# Patient Record
Sex: Male | Born: 1960 | Race: Black or African American | Hispanic: No | Marital: Single
Health system: Southern US, Community
[De-identification: ages and names within clinical notes are randomized; demographics above are authoritative.]

## PROBLEM LIST (undated history)

## (undated) DIAGNOSIS — Z9889 Other specified postprocedural states: Secondary | ICD-10-CM

## (undated) DIAGNOSIS — R569 Unspecified convulsions: Secondary | ICD-10-CM

## (undated) DIAGNOSIS — F79 Unspecified intellectual disabilities: Secondary | ICD-10-CM

## (undated) HISTORY — DX: Unspecified convulsions: R56.9

## (undated) HISTORY — PX: OTHER SURGICAL HISTORY: SHX169

## (undated) HISTORY — DX: Unspecified intellectual disabilities: F79

## (undated) HISTORY — DX: Other specified postprocedural states: Z98.890

---

## 2002-02-27 DIAGNOSIS — Z9889 Other specified postprocedural states: Secondary | ICD-10-CM

## 2002-02-27 HISTORY — DX: Other specified postprocedural states: Z98.890

## 2002-10-13 ENCOUNTER — Ambulatory Visit (HOSPITAL_COMMUNITY): Admission: RE | Admit: 2002-10-13 | Discharge: 2002-10-13 | Payer: Self-pay | Admitting: Internal Medicine

## 2004-04-28 ENCOUNTER — Ambulatory Visit (HOSPITAL_COMMUNITY): Admission: RE | Admit: 2004-04-28 | Discharge: 2004-04-28 | Payer: Self-pay | Admitting: General Surgery

## 2005-08-21 ENCOUNTER — Emergency Department (HOSPITAL_COMMUNITY): Admission: EM | Admit: 2005-08-21 | Discharge: 2005-08-21 | Payer: Self-pay | Admitting: Emergency Medicine

## 2010-08-06 ENCOUNTER — Emergency Department (HOSPITAL_COMMUNITY)
Admission: EM | Admit: 2010-08-06 | Discharge: 2010-08-06 | Disposition: A | Discharge status: Home / Self Care | Payer: Medicaid Other | Attending: Emergency Medicine | Admitting: Emergency Medicine

## 2010-08-06 DIAGNOSIS — G40909 Epilepsy, unspecified, not intractable, without status epilepticus: Secondary | ICD-10-CM | POA: Insufficient documentation

## 2010-08-06 DIAGNOSIS — K921 Melena: Secondary | ICD-10-CM | POA: Insufficient documentation

## 2010-08-06 LAB — PROTIME-INR: Prothrombin Time: 13.1 seconds (ref 11.6–15.2)

## 2010-08-06 LAB — BASIC METABOLIC PANEL
Creatinine, Ser: 1 mg/dL (ref 0.4–1.5)
GFR calc Af Amer: >60 mL/min (ref >60)
Glucose, Bld: 93 mg/dL (ref 70–99)
Potassium: 3.2 mEq/L — ABNORMAL LOW (ref 3.5–5.1)
Sodium: 140 mEq/L (ref 135–145)

## 2010-08-06 LAB — CBC
MCH: 33.2 pg (ref 26.0–34.0)
MCHC: 33.5 g/dL (ref 30.0–36.0)
MCV: 99 fL (ref 78.0–100.0)
RBC: 3.89 MIL/uL — ABNORMAL LOW (ref 4.22–5.81)
WBC: 4.2 10*3/uL (ref 4.0–10.5)

## 2010-08-11 ENCOUNTER — Ambulatory Visit (INDEPENDENT_AMBULATORY_CARE_PROVIDER_SITE_OTHER): Payer: Medicaid Other | Admitting: Gastroenterology

## 2010-08-11 ENCOUNTER — Encounter: Payer: Self-pay | Admitting: Gastroenterology

## 2010-08-11 VITALS — BP 112/71 | HR 67 | Temp 97.0°F | Ht 63.0 in | Wt 169.6 lb

## 2010-08-11 DIAGNOSIS — K625 Hemorrhage of anus and rectum: Secondary | ICD-10-CM | POA: Insufficient documentation

## 2010-08-11 NOTE — Progress Notes (Signed)
Referring Provider: Alice Reichert, MD Primary Care Physician:  Alice Reichert, MD Primary Gastroenterologist:  Dr. Jena Gauss  Chief Complaint  Patient presents with  . Rectal Bleeding    HPI:  Daniel Swanson is a 50 y.o. male here as a referral from Dr. Renard Matter for rectal bleeding. 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 His mother is here with him, who responded to questions asked. Mother states that rectal bleeding, small amount, has occurred intermittently. It is quite difficult to obtain a hx. Apparently, the pt went to the ED on Saturday. Hgb stable at 12.9. Mom states Mr. Montz normally has 1 large BM every 2-3 days. No change in bowel habits, no diarrhea. Pt does not act as if he is in pain. He is eating well, maintaining weight. Stays with uncle.   Past Medical History  Diagnosis Date  . Seizures     hx   . Mental retardation   . S/P colonoscopy 2004    normal (RMR)    Past Surgical History  Procedure Date  . None     Current Outpatient Prescriptions  Medication Sig Dispense Refill  . pantoprazole (PROTONIX) 40 MG tablet Take 40 mg by mouth daily.        Marland Kitchen PHENobarbital (LUMINAL) 32.4 MG tablet Take 32.4 mg by mouth 2 (two) times daily.        . potassium chloride SA (K-DUR,KLOR-CON) 20 MEQ tablet Take 20 mEq by mouth 2 (two) times daily.          Allergies as of 08/11/2010  . (No Known Allergies)    Family History  Problem Relation Age of Onset  . Diabetes Mother     living  . Colon cancer Maternal Aunt     living, unsure when diagnosed    History   Social History  . Marital Status: Single    Spouse Name: N/A    Number of Children: N/A  . Years of Education: N/A   Occupational History  . Not on file.   Social History Main Topics  . Smoking status: Never Smoker   . Smokeless tobacco: Not on file  . Alcohol Use: No  . Drug Use: No  . Sexually Active: Not on file     Review of Systems: Unable to obtain; pt is mentally  handicapped.  Physical Exam: BP 112/71  Pulse 67  Temp(Src) 97 F (36.1 C) (Temporal)  Ht 5\' 3"  (1.6 m)  Wt 169 lb 9.6 oz (76.93 kg)  BMI 30.04 kg/m2 General:   Well-nourished, sitting in chair, raises head to his name, smiles occasionally. Responds to simple commands to get on table.  Head:  Normocephalic and atraumatic. Eyes:  Sclera clear, no icterus.   Conjunctiva pink.  Ears:  Normal auditory acuity. Nose:  No deformity, discharge,  or lesions. Mouth:  Unable to assess. Pt will not open mouth.  Neck:  Supple; no masses or thyromegaly. Lungs:  Limited exam. Pt will not take in deep breaths; however, no wheezing noted, appears clear. Heart:  Regular rate and rhythm; no murmurs, clicks, rubs,  or gallops. Abdomen:  Soft, nontender and nondistended. No masses, hepatosplenomegaly or hernias noted. Normal bowel sounds, without guarding, and without rebound.   Rectal:  Deferred until time of colonoscopy.   Msk:  Symmetrical without gross deformities. Somewhat stooped and shuffles slightly when walks Extremities:  Without clubbing or edema. Neurologic:  Unable to assess Skin:  Intact without significant lesions or rashes. Psych:  Alert and cooperative. Normal mood and affect.

## 2010-08-11 NOTE — Progress Notes (Signed)
Cc to PCP 

## 2010-08-11 NOTE — Patient Instructions (Signed)
We have set you up for a colonoscopy.  Monitor for any signs of increased rectal bleeding, abdominal pain, nausea or vomiting.  Further recommendations to follow.  No changes to medications currently.

## 2010-08-11 NOTE — Assessment & Plan Note (Signed)
50 year old African-American, mentally handicapped pleasant man who presents with rectal bleeding, course unknown. Somewhat difficult to obtain hx, as pt does not communicate verbally. Mother was present and provided information. No change in bowel habits, rectal bleeding small amount and intermittent. One visit to ED, where Hgb was 12.9 (early June). Last colonoscopy in 2004 was normal. No wt loss or lack of appetite. No visible signs of abdominal pain per Mother. Likely hematochezia due to benign anorectal source, but unable to exclude other etiology such as occult malignancy at this time.  Proceed with TCS with Dr. Jena Gauss in near future: the risks, benefits, and alternatives have been discussed with the patient in detail. The patient states understanding and desires to proceed. Discussed in detail with Mother as well.

## 2010-09-05 ENCOUNTER — Encounter (HOSPITAL_COMMUNITY)
Admission: RE | Disposition: A | Discharge status: Home / Self Care | Payer: Self-pay | Source: Ambulatory Visit | Attending: Internal Medicine

## 2010-09-05 ENCOUNTER — Encounter: Payer: Medicaid Other | Admitting: Internal Medicine

## 2010-09-05 ENCOUNTER — Ambulatory Visit (HOSPITAL_COMMUNITY)
Admission: RE | Admit: 2010-09-05 | Discharge: 2010-09-05 | Disposition: A | Discharge status: Home / Self Care | Payer: Medicaid Other | Source: Ambulatory Visit | Attending: Internal Medicine | Admitting: Internal Medicine

## 2010-09-05 DIAGNOSIS — K648 Other hemorrhoids: Secondary | ICD-10-CM | POA: Insufficient documentation

## 2010-09-05 DIAGNOSIS — K921 Melena: Secondary | ICD-10-CM

## 2010-09-05 HISTORY — PX: COLONOSCOPY: SHX5424

## 2010-09-05 SURGERY — COLONOSCOPY
Anesthesia: Moderate Sedation | Site: Abdomen | Laterality: Left | Wound class: Clean

## 2010-09-05 MED ORDER — STERILE WATER FOR IRRIGATION IR SOLN
Status: DC | PRN
  Administered 2010-09-05: 14:00:00

## 2010-09-05 MED ORDER — BENEFIBER PO PACK: 1.0000 | PACK | Freq: Three times a day (TID) | ORAL | Status: AC

## 2010-09-05 MED ORDER — MEPERIDINE HCL 25 MG/ML IJ SOLN
INTRAMUSCULAR | Status: DC | PRN
  Administered 2010-09-05: 25 mg via INTRAVENOUS
  Administered 2010-09-05: 50 mg via INTRAVENOUS

## 2010-09-05 MED ORDER — HYDROCORTISONE ACETATE 25 MG RE SUPP: 25.0000 mg | RECTAL | Status: AC

## 2010-09-05 MED ORDER — SODIUM CHLORIDE 0.45 % IV SOLN
Freq: Once | INTRAVENOUS | Status: AC
  Administered 2010-09-05: 13:00:00 via INTRAVENOUS

## 2010-09-05 MED ORDER — MEPERIDINE HCL 100 MG/ML IJ SOLN
INTRAMUSCULAR | Status: AC
  Filled 2010-09-05: qty 2

## 2010-09-05 MED ORDER — MIDAZOLAM HCL 5 MG/5ML IJ SOLN
INTRAMUSCULAR | Status: DC | PRN
  Administered 2010-09-05: 2 mg via INTRAVENOUS
  Administered 2010-09-05: 1 mg via INTRAVENOUS

## 2010-09-05 MED ORDER — EPINEPHRINE HCL 1 MG/ML IJ SOLN
INTRAMUSCULAR | Status: AC
  Filled 2010-09-05: qty 1

## 2010-09-05 MED ORDER — MIDAZOLAM HCL 5 MG/5ML IJ SOLN
INTRAMUSCULAR | Status: AC
  Filled 2010-09-05: qty 10

## 2010-09-05 NOTE — Brief Op Note (Signed)
09/05/2010  4:07 PM  PATIENT:  Daniel Swanson  50 y.o. male  PRE-OPERATIVE DIAGNOSIS:  rectal bleeding  POST-OPERATIVE DIAGNOSIS:  anal canal hemmoroids  PROCEDURE:  Procedure(s): COLONOSCOPY  SURGEON:  Surgeon(s): Corbin Ade, MD  PHYSICIAN ASSISTANT:

## 2010-09-05 NOTE — Progress Notes (Signed)
Pre-Procedure time extended d/t Dr. Jena Gauss consulting on an emergent ICU in-patient. Pt comfortable,sleeping in room, warm blanket given, lights turned down. Family notified; RN in room/at bedside during wait time.

## 2010-09-05 NOTE — Brief Op Note (Signed)
09/05/2010  3:59 PM  PATIENT:   PRE-OPERATIVE DIAGNOSIS:  rectal bleeding  POST-OPERATIVE DIAGNOSIS:  anal canal hemmoroids  PROCEDURE:  Procedure(s): COLONOSCOPY  SURGEON:  Surgeon(s): Corbin Ade, MD  Marginal prep ; minimal anal can hemorrhoids; o/w negative 11 minutes out

## 2010-09-06 NOTE — Op Note (Signed)
Daniel Swanson, Daniel Swanson                ACCOUNT NO.:  [id number]  MEDICAL RECORD NO.:  [id number]  LOCATION:  [facility]                          FACILITY:  [facility]  PHYSICIAN:  R. Roetta Sessions, MD FACP FACGDATE OF BIRTH:  1960-10-19  DATE OF PROCEDURE:  09/05/2010 DATE OF DISCHARGE:  09/05/2010                              OPERATIVE REPORT   INDICATIONS FOR PROCEDURE:  A 50 year old gentleman with intermittent hematochezia.  Colonoscopy now being done to further evaluate the symptoms.  Risks, benefits, limitations, alternatives, imponderables have been discussed, questions answered.  Please see the documentation in the medical record.  PROCEDURE NOTE:  O2 saturation, blood pressure, pulse, respirations were monitored throughout the entire procedure.  CONSCIOUS SEDATION:  IV Versed, Demerol incremental doses.  INSTRUMENT:  Pentax video chip system.  Digital rectal exam revealed no abnormalities.  Endoscopic findings:  Reportedly, prep was poor on the left side which made the exam much more difficult.  Colon:  Colonic mucosa was surveyed from the rectosigmoid junction through the left transverse right colon to the appendiceal orifice, ileocecal valve/cecum.  These structures were seen and photographed for the record.  From this level, scope was slowly and cautiously withdrawn. All previously mentioned mucosal surfaces were again seen and spent quite a bit of time lavaging and suctioning out the viscous colonic effluent from the left colon getting better access on the way in.  On the way out, I was able to see mucosal surfaces fairly well with a flat small lesion may have been obscured in the left colon due to the poor prep.  The colonic mucosa appeared grossly normal.  Scope was pulled down the rectum where a thorough examination of rectal mucosa including retroflexed view of the anal verge demonstrated only some anal canal hemorrhoids.  The patient tolerated the procedure well.   Cecal withdrawal time 11 minutes.  IMPRESSION:  Anal canal hemorrhoids, otherwise normal rectum and colon, poor prep compromised the exam.  RECOMMENDATIONS: 1. Hemorrhoid literature provided to Mr. Kimmel. 2. Begin Anusol HC-Suppository one per rectum at bedtime x7 days. 3. Align probiotic 1 capsule daily. 4. Benefiber 1 tablespoon daily.     Jonathon Bellows, MD FACP [facility]     RMR/MEDQ  D:  09/05/2010  T:  09/06/2010  Job:  [id number]  cc:   Angus G. Renard Matter, MD Fax: [phone]

## 2010-09-15 ENCOUNTER — Encounter (HOSPITAL_COMMUNITY): Payer: Self-pay | Admitting: Internal Medicine

## 2015-03-24 ENCOUNTER — Emergency Department (HOSPITAL_COMMUNITY)
Admission: EM | Admit: 2015-03-24 | Discharge: 2015-03-24 | Disposition: A | Discharge status: Home / Self Care | Payer: Medicaid Other | Attending: Emergency Medicine | Admitting: Emergency Medicine

## 2015-03-24 ENCOUNTER — Emergency Department (HOSPITAL_COMMUNITY): Payer: Medicaid Other

## 2015-03-24 ENCOUNTER — Encounter (HOSPITAL_COMMUNITY): Payer: Self-pay | Admitting: Emergency Medicine

## 2015-03-24 DIAGNOSIS — Y9389 Activity, other specified: Secondary | ICD-10-CM | POA: Diagnosis not present

## 2015-03-24 DIAGNOSIS — Z043 Encounter for examination and observation following other accident: Secondary | ICD-10-CM | POA: Insufficient documentation

## 2015-03-24 DIAGNOSIS — F79 Unspecified intellectual disabilities: Secondary | ICD-10-CM | POA: Diagnosis not present

## 2015-03-24 DIAGNOSIS — Y9289 Other specified places as the place of occurrence of the external cause: Secondary | ICD-10-CM | POA: Diagnosis not present

## 2015-03-24 DIAGNOSIS — X58XXXA Exposure to other specified factors, initial encounter: Secondary | ICD-10-CM | POA: Insufficient documentation

## 2015-03-24 DIAGNOSIS — Z79899 Other long term (current) drug therapy: Secondary | ICD-10-CM | POA: Diagnosis not present

## 2015-03-24 DIAGNOSIS — Z9889 Other specified postprocedural states: Secondary | ICD-10-CM | POA: Diagnosis not present

## 2015-03-24 DIAGNOSIS — Y998 Other external cause status: Secondary | ICD-10-CM | POA: Diagnosis not present

## 2015-03-24 DIAGNOSIS — R569 Unspecified convulsions: Secondary | ICD-10-CM | POA: Insufficient documentation

## 2015-03-24 DIAGNOSIS — H5500 Unspecified nystagmus: Secondary | ICD-10-CM | POA: Insufficient documentation

## 2015-03-24 DIAGNOSIS — W19XXXA Unspecified fall, initial encounter: Secondary | ICD-10-CM | POA: Insufficient documentation

## 2015-03-24 DIAGNOSIS — W19XXXS Unspecified fall, sequela: Secondary | ICD-10-CM

## 2015-03-24 LAB — URINE MICROSCOPIC-ADD ON
BACTERIA UA: NONE SEEN
Squamous Epithelial / LPF: NONE SEEN
WBC UA: NONE SEEN WBC/hpf (ref 0–5)

## 2015-03-24 LAB — CBC WITH DIFFERENTIAL/PLATELET
BASOS ABS: 0 10*3/uL (ref 0.0–0.1)
Basophils Relative: 0 %
Eosinophils Absolute: 0 10*3/uL (ref 0.0–0.7)
Eosinophils Relative: 1 %
HEMATOCRIT: 39.9 % (ref 39.0–52.0)
Hemoglobin: 13.5 g/dL (ref 13.0–17.0)
LYMPHS ABS: 1 10*3/uL (ref 0.7–4.0)
LYMPHS PCT: 22 %
MCH: 34.2 pg — AB (ref 26.0–34.0)
MCHC: 33.8 g/dL (ref 30.0–36.0)
MCV: 101 fL — AB (ref 78.0–100.0)
MONO ABS: 0.3 10*3/uL (ref 0.1–1.0)
Monocytes Relative: 6 %
NEUTROS ABS: 3.1 10*3/uL (ref 1.7–7.7)
Neutrophils Relative %: 71 %
Platelets: 136 10*3/uL — ABNORMAL LOW (ref 150–400)
RBC: 3.95 MIL/uL — AB (ref 4.22–5.81)
RDW: 13.4 % (ref 11.5–15.5)
WBC: 4.4 10*3/uL (ref 4.0–10.5)

## 2015-03-24 LAB — URINALYSIS, ROUTINE W REFLEX MICROSCOPIC
Bilirubin Urine: NEGATIVE
GLUCOSE, UA: NEGATIVE mg/dL
Ketones, ur: NEGATIVE mg/dL
LEUKOCYTES UA: NEGATIVE
Nitrite: NEGATIVE
PH: 6.5 (ref 5.0–8.0)
Protein, ur: NEGATIVE mg/dL
Specific Gravity, Urine: <1.005 — ABNORMAL LOW (ref 1.005–1.030)

## 2015-03-24 LAB — COMPREHENSIVE METABOLIC PANEL
ALT: 14 U/L — AB (ref 17–63)
AST: 19 U/L (ref 15–41)
Albumin: 3.9 g/dL (ref 3.5–5.0)
Alkaline Phosphatase: 81 U/L (ref 38–126)
Anion gap: 6 (ref 5–15)
BUN: 14 mg/dL (ref 6–20)
CHLORIDE: 104 mmol/L (ref 101–111)
CO2: 30 mmol/L (ref 22–32)
Calcium: 9.2 mg/dL (ref 8.9–10.3)
Creatinine, Ser: 1.07 mg/dL (ref 0.61–1.24)
GFR calc Af Amer: >60 mL/min (ref >60)
Glucose, Bld: 92 mg/dL (ref 65–99)
POTASSIUM: 4.2 mmol/L (ref 3.5–5.1)
SODIUM: 140 mmol/L (ref 135–145)
Total Bilirubin: 0.6 mg/dL (ref 0.3–1.2)
Total Protein: 7.8 g/dL (ref 6.5–8.1)

## 2015-03-24 LAB — TROPONIN I

## 2015-03-24 LAB — PHENOBARBITAL LEVEL: Phenobarbital: 26.8 ug/mL (ref 15.0–40.0)

## 2015-03-24 NOTE — ED Provider Notes (Signed)
CSN: [id number]     Arrival date & time 03/24/15  0919 History   First MD Initiated Contact with Patient 03/24/15 [id number]     Chief Complaint  Patient presents with  . Fall     (Consider location/radiation/quality/duration/timing/severity/associated sxs/prior Treatment) HPI Comments: Mother/caregiver reports increase in falls. No new changes in diet. No changes in medications. No increase in seizures. No temp elevations. No recent injury to the head, or c/o headache. She request evaluation for the falls. Pt has an appointment soon with PCP, but mother request eval today.  Patient is a 55 y.o. male presenting with fall. The history is provided by a parent.  Fall This is a chronic problem. The current episode started yesterday. The problem occurs intermittently. The problem has been gradually worsening. Pertinent negatives include no fever, headaches or vomiting. Nothing aggravates the symptoms. He has tried nothing for the symptoms.    Past Medical History  Diagnosis Date  . Seizures (HCC)     hx   . Mental retardation   . S/P colonoscopy 2004    normal (RMR)   Past Surgical History  Procedure Laterality Date  . None    . Colonoscopy  09/05/2010    Procedure: COLONOSCOPY;  Surgeon: Corbin Ade, MD;  Location: [facility];  Service: Endoscopy;  Laterality: Left;   Family History  Problem Relation Age of Onset  . Diabetes Mother     living  . Colon cancer Maternal Aunt     living, unsure when diagnosed   Social History  Substance Use Topics  . Smoking status: Never Smoker   . Smokeless tobacco: None  . Alcohol Use: No    Review of Systems  Constitutional: Negative for fever and appetite change.       Information given by mother/caregiver.  Gastrointestinal: Negative for vomiting.  Neurological: Positive for seizures. Negative for headaches.  All other systems reviewed and are negative.     Allergies  Review of patient's allergies indicates no known  allergies.  Home Medications   Prior to Admission medications   Medication Sig Start Date End Date Taking? Authorizing Provider  PHENobarbital (LUMINAL) 32.4 MG tablet Take 32.4 mg by mouth 2 (two) times daily.     Yes Historical Provider, MD  Vitamin D, Ergocalciferol, (DRISDOL) 50000 units CAPS capsule Take 50,000 Units by mouth every 7 (seven) days.    Yes Historical Provider, MD   BP 108/77 mmHg  Pulse 69  Temp(Src) 98.5 F (36.9 C) (Oral)  Resp 18  Ht  (1.6 m)  Wt 68.04 kg  BMI 26.58 kg/m2  SpO2 95% Physical Exam  Constitutional: He appears well-developed and well-nourished.  Non-toxic appearance.  HENT:  Head: Normocephalic.  Right Ear: Tympanic membrane and external ear normal.  Left Ear: Tympanic membrane and external ear normal.  Eyes: EOM and lids are normal. Pupils are equal, round, and reactive to light.  Nystagmus present bilat.  Neck: Normal range of motion. Neck supple. Carotid bruit is not present.  Cardiovascular: Normal rate, regular rhythm, normal heart sounds, intact distal pulses and normal pulses.   Pulmonary/Chest: Breath sounds normal. No respiratory distress.  Abdominal: Soft. Bowel sounds are normal. There is no tenderness. There is no guarding.  Musculoskeletal: Normal range of motion.  Stiffness of upper extremities bilat.  Lymphadenopathy:       Head (right side): No submandibular adenopathy present.       Head (left side): No submandibular adenopathy present.    He has  no cervical adenopathy.  Neurological: He is alert. He has normal strength. No sensory deficit.  Pupils are equal. Pt indicates facial sensation present. Gag reflex present. Pt moves all extremities, but with some stiffness.  Pt able to stand, but with assistance. (this is his baseline per mother)  Skin: Skin is warm and dry.  No noted bruises or abrasions.  Psychiatric: He has a normal mood and affect. His speech is normal.  Nursing note and vitals reviewed.   ED Course   Procedures (including critical care time) Labs Review Labs Reviewed - No data to display  Imaging Review No results found. I have personally reviewed and evaluated these images and lab results as part of my medical decision-making.   EKG Interpretation None      MDM  UA negative for UTI or other problems. Cmet non-acute. Troponin I neg for acute event. Platelets low, but not other problem with CBC. Chest xray is negative for acute issue. Phenobarb level is wnl at 26.8. Pt drinking in the ED without problem. Pt able to stand with assistance c/w baseline. All results and findings reviewed with mother. Suggest she discuss this with PCP to complete the work up. Mother in agreement with discharge plan.   Final diagnoses:  Falls, initial encounter  Mental retardation    *I have reviewed nursing notes, vital signs, and all appropriate lab and imaging results for this patient.**    Ivery Quale, PA-C 03/28/15 2151  Bethann Berkshire, MD 03/28/15 2151

## 2015-03-24 NOTE — Discharge Instructions (Signed)
Daniel Swanson's Chest xray, and labs are non-acute today. The phenobarbital level is within normal limits.His vital signs are within normal limits. Please see Dr Renard Matter for additional referral or workup of the increasing falls.

## 2015-03-24 NOTE — ED Notes (Signed)
Pt's mother reports an increase in falls. States that pt fell twice yesterday. Denies dizziness. Denies LOC.

## 2015-08-04 DIAGNOSIS — G40909 Epilepsy, unspecified, not intractable, without status epilepticus: Secondary | ICD-10-CM | POA: Diagnosis not present

## 2015-08-04 DIAGNOSIS — F79 Unspecified intellectual disabilities: Secondary | ICD-10-CM | POA: Diagnosis not present

## 2015-08-04 DIAGNOSIS — E559 Vitamin D deficiency, unspecified: Secondary | ICD-10-CM | POA: Diagnosis not present

## 2015-08-04 DIAGNOSIS — Z79899 Other long term (current) drug therapy: Secondary | ICD-10-CM | POA: Diagnosis not present

## 2016-02-08 DIAGNOSIS — Z79899 Other long term (current) drug therapy: Secondary | ICD-10-CM | POA: Diagnosis not present

## 2016-02-08 DIAGNOSIS — G40909 Epilepsy, unspecified, not intractable, without status epilepticus: Secondary | ICD-10-CM | POA: Diagnosis not present

## 2016-02-08 DIAGNOSIS — F79 Unspecified intellectual disabilities: Secondary | ICD-10-CM | POA: Diagnosis not present

## 2016-02-08 DIAGNOSIS — E559 Vitamin D deficiency, unspecified: Secondary | ICD-10-CM | POA: Diagnosis not present

## 2016-03-08 DIAGNOSIS — H4720 Unspecified optic atrophy: Secondary | ICD-10-CM | POA: Diagnosis not present

## 2016-08-07 DIAGNOSIS — G40909 Epilepsy, unspecified, not intractable, without status epilepticus: Secondary | ICD-10-CM | POA: Diagnosis not present

## 2016-08-07 DIAGNOSIS — Z79899 Other long term (current) drug therapy: Secondary | ICD-10-CM | POA: Diagnosis not present

## 2016-08-07 DIAGNOSIS — F79 Unspecified intellectual disabilities: Secondary | ICD-10-CM | POA: Diagnosis not present

## 2016-08-07 DIAGNOSIS — E559 Vitamin D deficiency, unspecified: Secondary | ICD-10-CM | POA: Diagnosis not present

## 2016-08-24 ENCOUNTER — Encounter (HOSPITAL_COMMUNITY): Payer: Self-pay | Admitting: Cardiology

## 2016-08-24 ENCOUNTER — Emergency Department (HOSPITAL_COMMUNITY): Payer: Medicare Other

## 2016-08-24 ENCOUNTER — Emergency Department (HOSPITAL_COMMUNITY)
Admission: EM | Admit: 2016-08-24 | Discharge: 2016-08-24 | Disposition: A | Discharge status: Home / Self Care | Payer: Medicare Other | Attending: Emergency Medicine | Admitting: Emergency Medicine

## 2016-08-24 DIAGNOSIS — Y92002 Bathroom of unspecified non-institutional (private) residence single-family (private) house as the place of occurrence of the external cause: Secondary | ICD-10-CM | POA: Insufficient documentation

## 2016-08-24 DIAGNOSIS — W182XXA Fall in (into) shower or empty bathtub, initial encounter: Secondary | ICD-10-CM | POA: Diagnosis not present

## 2016-08-24 DIAGNOSIS — Y939 Activity, unspecified: Secondary | ICD-10-CM | POA: Diagnosis not present

## 2016-08-24 DIAGNOSIS — Y999 Unspecified external cause status: Secondary | ICD-10-CM | POA: Insufficient documentation

## 2016-08-24 DIAGNOSIS — S20412A Abrasion of left back wall of thorax, initial encounter: Secondary | ICD-10-CM | POA: Diagnosis not present

## 2016-08-24 DIAGNOSIS — S299XXA Unspecified injury of thorax, initial encounter: Secondary | ICD-10-CM | POA: Diagnosis present

## 2016-08-24 DIAGNOSIS — W19XXXA Unspecified fall, initial encounter: Secondary | ICD-10-CM | POA: Diagnosis not present

## 2016-08-24 DIAGNOSIS — S20222A Contusion of left back wall of thorax, initial encounter: Secondary | ICD-10-CM | POA: Diagnosis not present

## 2016-08-24 DIAGNOSIS — R079 Chest pain, unspecified: Secondary | ICD-10-CM | POA: Diagnosis not present

## 2016-08-24 NOTE — Discharge Instructions (Signed)
Take tylenol for pain and follow up with your md next week if not improiving.

## 2016-08-24 NOTE — ED Provider Notes (Signed)
[facility] Provider Note   CSN: [id number] Arrival date & time: 08/24/16  [id number]     History   Chief Complaint Chief Complaint  Patient presents with  . Fall    HPI Daniel Swanson is a 56 y.o. male.  Patient fell in the bathroom several days ago. His mother brought him in for evaluation. Patient has abrasions to his left upper back.   The history is provided by the patient. No language interpreter was used.  Fall  This is a new problem. The current episode started more than 2 days ago. The problem occurs rarely. The problem has been resolved. Pertinent negatives include no chest pain and no abdominal pain. Nothing aggravates the symptoms. Nothing relieves the symptoms.    Past Medical History:  Diagnosis Date  . Mental retardation   . S/P colonoscopy 2004   normal (RMR)  . Seizures (HCC)    hx     Patient Active Problem List   Diagnosis Date Noted  . Rectal bleeding 08/11/2010    Past Surgical History:  Procedure Laterality Date  . COLONOSCOPY  09/05/2010   Procedure: COLONOSCOPY;  Surgeon: Corbin Adeobert M Rourk, MD;  Location: [facility];  Service: Endoscopy;  Laterality: Left;  . None         Home Medications    Prior to Admission medications   Medication Sig Start Date End Date Taking? Authorizing Provider  PHENobarbital (LUMINAL) 32.4 MG tablet Take 32.4 mg by mouth 2 (two) times daily.     Yes [provider]  Vitamin D, Ergocalciferol, (DRISDOL) 50000 units CAPS capsule Take 50,000 Units by mouth every 7 (seven) days.    Yes [provider]    Family History Family History  Problem Relation Age of Onset  . Diabetes Mother        living  . Colon cancer Maternal Aunt        living, unsure when diagnosed    Social History Social History  Substance Use Topics  . Smoking status: Never Smoker  . Smokeless tobacco: Never Used  . Alcohol use No     Allergies   Patient has no known allergies.   Review of Systems Review  of Systems  Unable to perform ROS: Mental status change  Cardiovascular: Negative for chest pain.  Gastrointestinal: Negative for abdominal pain.     Physical Exam Updated Vital Signs BP 115/83 (BP Location: Right Arm)   Pulse 72   Temp 98.9 F (37.2 C) (Oral)   Resp 20   Ht 5\' 4"  (1.626 m)   Wt 70.3 kg (155 lb)   SpO2 100%   BMI 26.61 kg/m   Physical Exam  Constitutional: He is oriented to person, place, and time. He appears well-developed.  HENT:  Head: Normocephalic.  Eyes: Conjunctivae and EOM are normal. No scleral icterus.  Neck: Neck supple. No thyromegaly present.  Cardiovascular: Normal rate and regular rhythm.  Exam reveals no gallop and no friction rub.   No murmur heard. Pulmonary/Chest: No stridor. He has no wheezes. He has no rales. He exhibits no tenderness.  Abdominal: He exhibits no distension. There is no tenderness. There is no rebound.  Musculoskeletal: Normal range of motion. He exhibits no edema.  Abrasions to upper back  Lymphadenopathy:    He has no cervical adenopathy.  Neurological: He is oriented to person, place, and time. He exhibits normal muscle tone. Coordination normal.  Skin: No rash noted. No erythema.  Psychiatric: He has a normal  mood and affect. His behavior is normal.     ED Treatments / Results  Labs (all labs ordered are listed, but only abnormal results are displayed) Labs Reviewed - No data to display  EKG  EKG Interpretation None       Radiology Dg Chest 2 View  Result Date: 08/24/2016 CLINICAL DATA:  Recent fall with chest pain, initial encounter EXAM: CHEST  2 VIEW COMPARISON:  03/24/2015 FINDINGS: Cardiac shadow is within normal limits. The lungs are well aerated bilaterally. No infiltrate, effusion or pneumothorax is seen. Degenerative changes of thoracic spine are noted. No definitive rib fracture is seen. IMPRESSION: No acute abnormality noted. Electronically Signed   By: Alcide Clever M.D.   On: 08/24/2016 10:49      Procedures Procedures (including critical care time)  Medications Ordered in ED Medications - No data to display   Initial Impression / Assessment and Plan / ED Course  I have reviewed the triage vital signs and the nursing notes.  Pertinent labs & imaging results that were available during my care of the patient were reviewed by me and considered in my medical decision making (see chart for details).     Contusion to upper back. X-ray unremarkable. Patient will take Tylenol for discomfort  Final Clinical Impressions(s) / ED Diagnoses   Final diagnoses:  Fall, initial encounter    New Prescriptions New Prescriptions   No medications on file     Bethann Berkshire, MD 08/24/16 1300

## 2016-08-24 NOTE — ED Triage Notes (Signed)
Fell into bathtub yesterday evening.  Mother is caretaker and noticed abrasions to upper left back.  Also states when she was bathing him he pulled back when she touched his left elbow.

## 2016-08-24 NOTE — ED Notes (Signed)
Abrasion to left scapula, right hand.  Able to raise left arm withoujt difficulty.

## 2016-09-14 DIAGNOSIS — E668 Other obesity: Secondary | ICD-10-CM | POA: Diagnosis not present

## 2016-09-14 DIAGNOSIS — R2681 Unsteadiness on feet: Secondary | ICD-10-CM | POA: Diagnosis not present

## 2016-09-14 DIAGNOSIS — F79 Unspecified intellectual disabilities: Secondary | ICD-10-CM | POA: Diagnosis not present

## 2016-09-14 DIAGNOSIS — E559 Vitamin D deficiency, unspecified: Secondary | ICD-10-CM | POA: Diagnosis not present

## 2016-09-14 DIAGNOSIS — N393 Stress incontinence (female) (male): Secondary | ICD-10-CM | POA: Diagnosis not present

## 2016-09-14 DIAGNOSIS — G4089 Other seizures: Secondary | ICD-10-CM | POA: Diagnosis not present

## 2016-09-14 DIAGNOSIS — B351 Tinea unguium: Secondary | ICD-10-CM | POA: Diagnosis not present

## 2016-09-14 DIAGNOSIS — Z0001 Encounter for general adult medical examination with abnormal findings: Secondary | ICD-10-CM | POA: Diagnosis not present

## 2017-01-22 ENCOUNTER — Emergency Department (HOSPITAL_COMMUNITY): Payer: Medicare Other

## 2017-01-22 ENCOUNTER — Other Ambulatory Visit: Payer: Self-pay

## 2017-01-22 ENCOUNTER — Encounter (HOSPITAL_COMMUNITY): Payer: Self-pay | Admitting: Emergency Medicine

## 2017-01-22 ENCOUNTER — Emergency Department (HOSPITAL_COMMUNITY)
Admission: EM | Admit: 2017-01-22 | Discharge: 2017-01-22 | Disposition: A | Discharge status: Home / Self Care | Payer: Medicare Other | Attending: Emergency Medicine | Admitting: Emergency Medicine

## 2017-01-22 DIAGNOSIS — W01190A Fall on same level from slipping, tripping and stumbling with subsequent striking against furniture, initial encounter: Secondary | ICD-10-CM | POA: Insufficient documentation

## 2017-01-22 DIAGNOSIS — S42001A Fracture of unspecified part of right clavicle, initial encounter for closed fracture: Secondary | ICD-10-CM | POA: Diagnosis not present

## 2017-01-22 DIAGNOSIS — Y92012 Bathroom of single-family (private) house as the place of occurrence of the external cause: Secondary | ICD-10-CM | POA: Diagnosis not present

## 2017-01-22 DIAGNOSIS — F79 Unspecified intellectual disabilities: Secondary | ICD-10-CM | POA: Diagnosis not present

## 2017-01-22 DIAGNOSIS — S42034A Nondisplaced fracture of lateral end of right clavicle, initial encounter for closed fracture: Secondary | ICD-10-CM | POA: Diagnosis not present

## 2017-01-22 DIAGNOSIS — S0993XA Unspecified injury of face, initial encounter: Secondary | ICD-10-CM | POA: Diagnosis not present

## 2017-01-22 DIAGNOSIS — R569 Unspecified convulsions: Secondary | ICD-10-CM | POA: Insufficient documentation

## 2017-01-22 DIAGNOSIS — Y999 Unspecified external cause status: Secondary | ICD-10-CM | POA: Diagnosis not present

## 2017-01-22 DIAGNOSIS — Y9389 Activity, other specified: Secondary | ICD-10-CM | POA: Insufficient documentation

## 2017-01-22 DIAGNOSIS — K029 Dental caries, unspecified: Secondary | ICD-10-CM | POA: Diagnosis not present

## 2017-01-22 DIAGNOSIS — S199XXA Unspecified injury of neck, initial encounter: Secondary | ICD-10-CM | POA: Diagnosis not present

## 2017-01-22 DIAGNOSIS — W19XXXA Unspecified fall, initial encounter: Secondary | ICD-10-CM

## 2017-01-22 MED ORDER — AMOXICILLIN 500 MG PO CAPS: 500.0000 mg | ORAL_CAPSULE | Freq: Three times a day (TID) | ORAL | 0 refills | Status: AC

## 2017-01-22 MED ORDER — ACETAMINOPHEN 160 MG/5ML PO SOLN
650.0000 mg | Freq: Once | ORAL | Status: AC
  Administered 2017-01-22: 650 mg via ORAL
  Filled 2017-01-22: qty 20.3

## 2017-01-22 MED ORDER — HYDROCODONE-ACETAMINOPHEN 7.5-325 MG/15ML PO SOLN: 10.0000 mL | Freq: Four times a day (QID) | ORAL | 0 refills | Status: AC | PRN

## 2017-01-22 MED ORDER — IOPAMIDOL (ISOVUE-300) INJECTION 61%
75.0000 mL | Freq: Once | INTRAVENOUS | Status: AC | PRN
  Administered 2017-01-22: 75 mL via INTRAVENOUS

## 2017-01-22 NOTE — ED Provider Notes (Signed)
[facility] EMERGENCY DEPARTMENT Provider Note   CSN: [id number] Arrival date & time: 01/22/17  [id number]     History   Chief Complaint Chief Complaint  Patient presents with  . Fall    HPI Daniel Swanson is a 56 y.o. male.  Patient is a 56 year old male who suffers from mental retardation and seizures.  The patient's mother is the primary historian.  She states that the patient has falls from time to time.  He had a fall a few days ago, but did not seem to hurt himself.  He had another fall this morning and this time grabbed a hold of his side while coming out of the bathroom and hollering out.  She thinks it may have been the left side, but she is not sure.  She states he also rubbed the right side of his head as though it may have been hurting him.  She gave him Tylenol, and brought him to the emergency department for evaluation since he cannot speak for himself.     The history is provided by a parent.    Past Medical History:  Diagnosis Date  . Mental retardation   . S/P colonoscopy 2004   normal (RMR)  . Seizures (HCC)    hx     Patient Active Problem List   Diagnosis Date Noted  . Rectal bleeding 08/11/2010    Past Surgical History:  Procedure Laterality Date  . COLONOSCOPY  09/05/2010   Procedure: COLONOSCOPY;  Surgeon: Corbin Adeobert M Rourk, MD;  Location: [facility];  Service: Endoscopy;  Laterality: Left;  . None         Home Medications    Prior to Admission medications   Medication Sig Start Date End Date Taking? Authorizing Provider  PHENobarbital (LUMINAL) 32.4 MG tablet Take 32.4 mg by mouth 2 (two) times daily.      [provider]  Vitamin D, Ergocalciferol, (DRISDOL) 50000 units CAPS capsule Take 50,000 Units by mouth every 7 (seven) days.     [provider]    Family History Family History  Problem Relation Age of Onset  . Diabetes Mother        living  . Colon cancer Maternal Aunt        living, unsure when diagnosed     Social History Social History   Tobacco Use  . Smoking status: Never Smoker  . Smokeless tobacco: Never Used  Substance Use Topics  . Alcohol use: No  . Drug use: No     Allergies   Patient has no known allergies.   Review of Systems Review of Systems  Unable to perform ROS: Patient nonverbal     Physical Exam Updated Vital Signs BP 124/83 (BP Location: Right Arm)   Pulse 73   Temp 97.8 F (36.6 C) (Oral)   Resp 18   Ht 5\' 4"  (1.626 m)   Wt 70.3 kg (155 lb)   SpO2 97%   BMI 26.61 kg/m   Physical Exam  Constitutional: He is oriented to person, place, and time. He appears well-developed and well-nourished.  Non-toxic appearance.  HENT:  Head: Normocephalic.  Right Ear: Tympanic membrane and external ear normal.  Left Ear: Tympanic membrane and external ear normal.  Few dental caries noted.  Patient would not cooperate for examination.  No hot areas noted of the face, the upper or lower jaw.  Eyes: EOM and lids are normal. Pupils are equal, round, and reactive to light.  Neck: Normal  range of motion. Neck supple. Carotid bruit is not present.  Cardiovascular: Normal rate, regular rhythm, normal heart sounds, intact distal pulses and normal pulses.  Pulmonary/Chest: Breath sounds normal. No respiratory distress.  Abdominal: Soft. Bowel sounds are normal. There is no tenderness. There is no guarding.  Musculoskeletal: Normal range of motion.  There are contractures of the neck.  Contractures of the upper and lower extremities.  Radial pulses are 2+  Dorsalis pedis pulses are 2+.  No deformity of the upper extremities.  Patient does seem to wince when the right shoulder and chest area are palpated.  Lymphadenopathy:       Head (right side): No submandibular adenopathy present.       Head (left side): No submandibular adenopathy present.    He has no cervical adenopathy.  Neurological: He is alert and oriented to person, place, and time. He has normal  strength. No cranial nerve deficit or sensory deficit.  Skin: Skin is warm and dry.  Psychiatric: He has a normal mood and affect. His speech is normal.  Nursing note and vitals reviewed.    ED Treatments / Results  Labs (all labs ordered are listed, but only abnormal results are displayed) Labs Reviewed - No data to display  EKG  EKG Interpretation None       Radiology No results found.  Procedures Procedures (including critical care time)  Medications Ordered in ED Medications - No data to display   Initial Impression / Assessment and Plan / ED Course  I have reviewed the triage vital signs and the nursing notes.  Pertinent labs & imaging results that were available during my care of the patient were reviewed by me and considered in my medical decision making (see chart for details).       Final Clinical Impressions(s) / ED Diagnoses MDM Vital signs reviewed.  CT maxillofacial scan reveals multiple dental caries with bone resorption around multiple teeth.  CT scan of the cervical spine reveals mild disc degeneration and spurring at C5-C6 and C4-C5.  This is causing some spinal stenosis.  There is also question of a clavicle fracture.  X-ray of the right clavicle shows a minimally displaced fracture of the distal right clavicle.  Patient would not cooperate for sling for figure-of-eight.  Have discussed all the findings with the mother in terms which she understands.  Questions were answered.  Patient will be treated medically.  He will use Tylenol for mild to moderate pain.  He will use hydrocodone for more severe pain.  He is to follow-up with Dr. Romeo AppleHarrison for additional evaluation and management.   Final diagnoses:  Fall  Closed nondisplaced fracture of right clavicle, unspecified part of clavicle, initial encounter  Dental caries    ED Discharge Orders        Ordered    amoxicillin (AMOXIL) 500 MG capsule  3 times daily     01/22/17 1419     HYDROcodone-acetaminophen (HYCET) 7.5-325 mg/15 ml solution  Every 6 hours PRN     01/22/17 1419       Ivery QualeBryant, Oneisha Ammons, PA-C 01/22/17 2022    Vanetta MuldersZackowski, Scott, MD 01/24/17 1614

## 2017-01-22 NOTE — Discharge Instructions (Signed)
The CT scan of the facial bones are negative for acute fracture.  There are noted multiple cavities and a few areas of possible abscess.  Please use Amoxil 3 times daily until all taken.  The CT scan and x-ray of the neck and shoulder show a broken collarbone.  Please use Tylenol for mild pain, use hydrocodone for more severe pain.  Hydrocodone may cause drowsiness, so use with caution.  Please see Dr. Selena BattenKim in the office for follow-up of both the cavities as well as the broken collarbone within the next week.

## 2017-01-22 NOTE — ED Triage Notes (Signed)
Mother reports pt fell yesterday at home after tripping over his chair and reports this morning pt grabbed his left side while coming out of the bathroom and hollered out.

## 2017-01-29 DIAGNOSIS — S42001D Fracture of unspecified part of right clavicle, subsequent encounter for fracture with routine healing: Secondary | ICD-10-CM | POA: Diagnosis not present

## 2017-01-29 DIAGNOSIS — G4089 Other seizures: Secondary | ICD-10-CM | POA: Diagnosis not present

## 2017-01-29 DIAGNOSIS — W19XXXD Unspecified fall, subsequent encounter: Secondary | ICD-10-CM | POA: Diagnosis not present

## 2017-01-29 DIAGNOSIS — F79 Unspecified intellectual disabilities: Secondary | ICD-10-CM | POA: Diagnosis not present

## 2017-01-29 DIAGNOSIS — M4802 Spinal stenosis, cervical region: Secondary | ICD-10-CM | POA: Diagnosis not present

## 2017-01-29 DIAGNOSIS — M5030 Other cervical disc degeneration, unspecified cervical region: Secondary | ICD-10-CM | POA: Diagnosis not present

## 2017-01-29 DIAGNOSIS — K029 Dental caries, unspecified: Secondary | ICD-10-CM | POA: Diagnosis not present

## 2017-02-09 DIAGNOSIS — Z79899 Other long term (current) drug therapy: Secondary | ICD-10-CM | POA: Diagnosis not present

## 2017-02-09 DIAGNOSIS — E559 Vitamin D deficiency, unspecified: Secondary | ICD-10-CM | POA: Diagnosis not present

## 2017-02-09 DIAGNOSIS — G40909 Epilepsy, unspecified, not intractable, without status epilepticus: Secondary | ICD-10-CM | POA: Diagnosis not present

## 2017-02-09 DIAGNOSIS — F79 Unspecified intellectual disabilities: Secondary | ICD-10-CM | POA: Diagnosis not present

## 2017-03-01 DIAGNOSIS — Z79899 Other long term (current) drug therapy: Secondary | ICD-10-CM | POA: Diagnosis not present

## 2017-03-01 DIAGNOSIS — F79 Unspecified intellectual disabilities: Secondary | ICD-10-CM | POA: Diagnosis not present

## 2017-03-01 DIAGNOSIS — G4089 Other seizures: Secondary | ICD-10-CM | POA: Diagnosis not present

## 2017-03-01 DIAGNOSIS — E559 Vitamin D deficiency, unspecified: Secondary | ICD-10-CM | POA: Diagnosis not present

## 2017-03-14 DIAGNOSIS — H4720 Unspecified optic atrophy: Secondary | ICD-10-CM | POA: Diagnosis not present

## 2017-08-03 DIAGNOSIS — E559 Vitamin D deficiency, unspecified: Secondary | ICD-10-CM | POA: Diagnosis not present

## 2017-08-03 DIAGNOSIS — Z79899 Other long term (current) drug therapy: Secondary | ICD-10-CM | POA: Diagnosis not present

## 2017-08-03 DIAGNOSIS — G40909 Epilepsy, unspecified, not intractable, without status epilepticus: Secondary | ICD-10-CM | POA: Diagnosis not present

## 2017-08-03 DIAGNOSIS — F79 Unspecified intellectual disabilities: Secondary | ICD-10-CM | POA: Diagnosis not present

## 2017-08-21 DIAGNOSIS — Z131 Encounter for screening for diabetes mellitus: Secondary | ICD-10-CM | POA: Diagnosis not present

## 2017-08-21 DIAGNOSIS — Z13220 Encounter for screening for lipoid disorders: Secondary | ICD-10-CM | POA: Diagnosis not present

## 2017-08-21 DIAGNOSIS — G4089 Other seizures: Secondary | ICD-10-CM | POA: Diagnosis not present

## 2017-08-21 DIAGNOSIS — Z0001 Encounter for general adult medical examination with abnormal findings: Secondary | ICD-10-CM | POA: Diagnosis not present

## 2017-08-21 DIAGNOSIS — Z79899 Other long term (current) drug therapy: Secondary | ICD-10-CM | POA: Diagnosis not present

## 2017-08-21 DIAGNOSIS — Z125 Encounter for screening for malignant neoplasm of prostate: Secondary | ICD-10-CM | POA: Diagnosis not present

## 2017-08-21 DIAGNOSIS — E559 Vitamin D deficiency, unspecified: Secondary | ICD-10-CM | POA: Diagnosis not present

## 2017-09-06 DIAGNOSIS — G4089 Other seizures: Secondary | ICD-10-CM | POA: Diagnosis not present

## 2017-09-06 DIAGNOSIS — F79 Unspecified intellectual disabilities: Secondary | ICD-10-CM | POA: Diagnosis not present

## 2017-09-06 DIAGNOSIS — Z79899 Other long term (current) drug therapy: Secondary | ICD-10-CM | POA: Diagnosis not present

## 2017-09-06 DIAGNOSIS — E559 Vitamin D deficiency, unspecified: Secondary | ICD-10-CM | POA: Diagnosis not present

## 2017-09-19 DIAGNOSIS — G4089 Other seizures: Secondary | ICD-10-CM | POA: Diagnosis not present

## 2017-09-19 DIAGNOSIS — Z79899 Other long term (current) drug therapy: Secondary | ICD-10-CM | POA: Diagnosis not present

## 2017-09-19 DIAGNOSIS — E559 Vitamin D deficiency, unspecified: Secondary | ICD-10-CM | POA: Diagnosis not present

## 2017-09-27 DIAGNOSIS — Z0001 Encounter for general adult medical examination with abnormal findings: Secondary | ICD-10-CM | POA: Diagnosis not present

## 2017-09-27 DIAGNOSIS — E559 Vitamin D deficiency, unspecified: Secondary | ICD-10-CM | POA: Diagnosis not present

## 2017-09-27 DIAGNOSIS — Z79899 Other long term (current) drug therapy: Secondary | ICD-10-CM | POA: Diagnosis not present

## 2017-09-27 DIAGNOSIS — E668 Other obesity: Secondary | ICD-10-CM | POA: Diagnosis not present

## 2017-09-27 DIAGNOSIS — G4089 Other seizures: Secondary | ICD-10-CM | POA: Diagnosis not present

## 2017-09-27 DIAGNOSIS — F79 Unspecified intellectual disabilities: Secondary | ICD-10-CM | POA: Diagnosis not present

## 2017-10-18 DIAGNOSIS — D649 Anemia, unspecified: Secondary | ICD-10-CM | POA: Diagnosis not present

## 2017-11-06 DIAGNOSIS — K029 Dental caries, unspecified: Secondary | ICD-10-CM | POA: Diagnosis not present

## 2017-11-06 DIAGNOSIS — Z79899 Other long term (current) drug therapy: Secondary | ICD-10-CM | POA: Diagnosis not present

## 2017-11-06 DIAGNOSIS — F79 Unspecified intellectual disabilities: Secondary | ICD-10-CM | POA: Diagnosis not present

## 2017-11-06 DIAGNOSIS — G4089 Other seizures: Secondary | ICD-10-CM | POA: Diagnosis not present

## 2018-02-11 DIAGNOSIS — Z79899 Other long term (current) drug therapy: Secondary | ICD-10-CM | POA: Diagnosis not present

## 2018-02-11 DIAGNOSIS — E559 Vitamin D deficiency, unspecified: Secondary | ICD-10-CM | POA: Diagnosis not present

## 2018-02-11 DIAGNOSIS — F79 Unspecified intellectual disabilities: Secondary | ICD-10-CM | POA: Diagnosis not present

## 2018-02-11 DIAGNOSIS — G40909 Epilepsy, unspecified, not intractable, without status epilepticus: Secondary | ICD-10-CM | POA: Diagnosis not present

## 2018-12-22 IMAGING — CT CT CERVICAL SPINE W/O CM
3 of 4 series · 13 of 33 positions shown, 16 images · non-contrast
Comparison: None.

CLINICAL DATA: Fall

EXAM:
CT MAXILLOFACIAL WITHOUT CONTRAST
CT CERVICAL SPINE WITHOUT CONTRAST
TECHNIQUE: Multidetector CT imaging of the maxillofacial structures was
performed. Multiplanar CT image reconstructions were also generated.
A small metallic BB was placed on the right temple in order to
reliably differentiate right from left.
Multidetector CT imaging of the cervical spine was performed without
intravenous contrast. Multiplanar CT image reconstructions were also
generated.

[Series 4: c spine soft · axial · 0.45mm/px · z∈[+1370,+1500]mm · 6 of 86 slices shown, 8 images]
[im 14/86  soft-tissue]
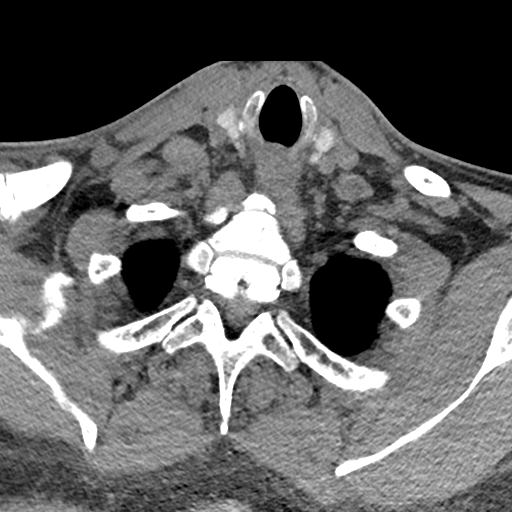
[im 14/86  bone]
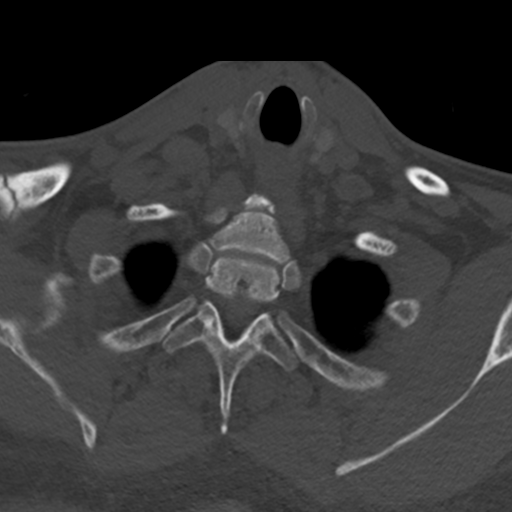
[im 27/86  bone]
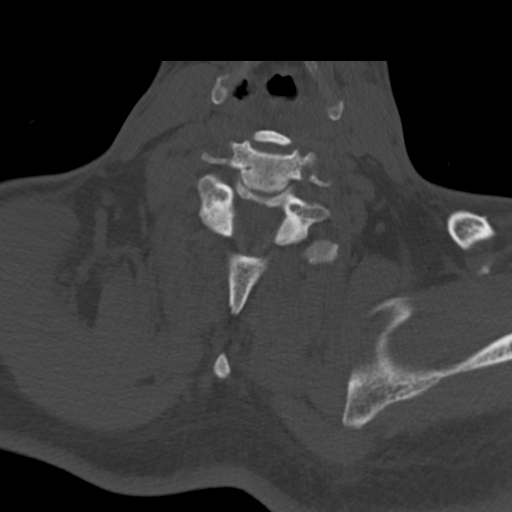
[im 40/86  bone]
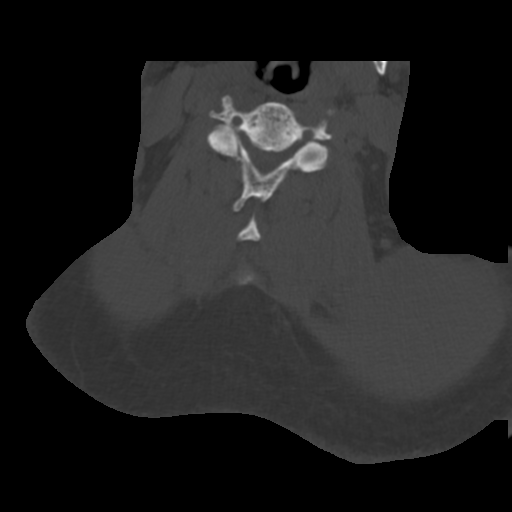
[im 53/86  bone]
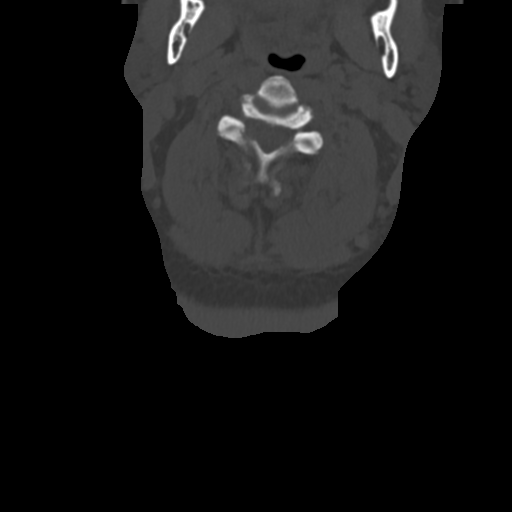
[im 66/86  soft-tissue]
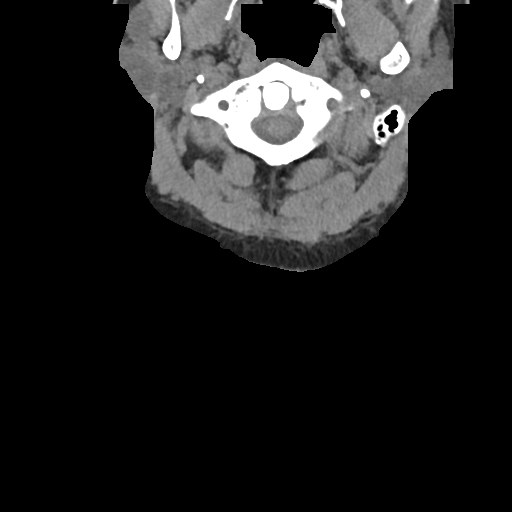
[im 66/86  bone]
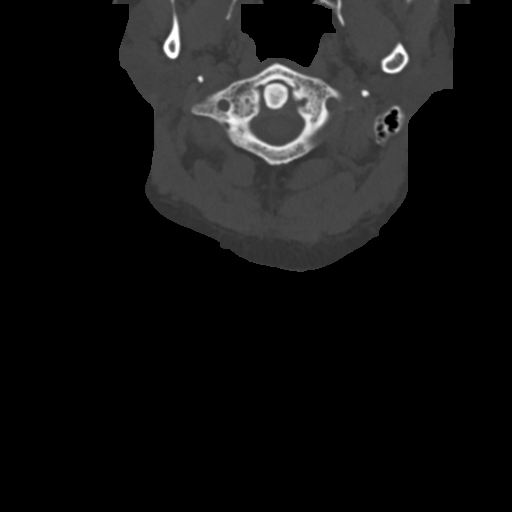
[im 79/86  bone]
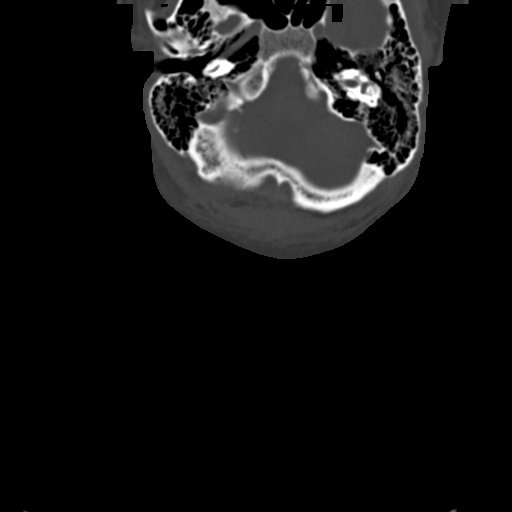

[Series 5: sagittal bone · sagittal · 0.33mm/px · 5 of 61 slices shown, 6 images]
[im 21/61  bone]
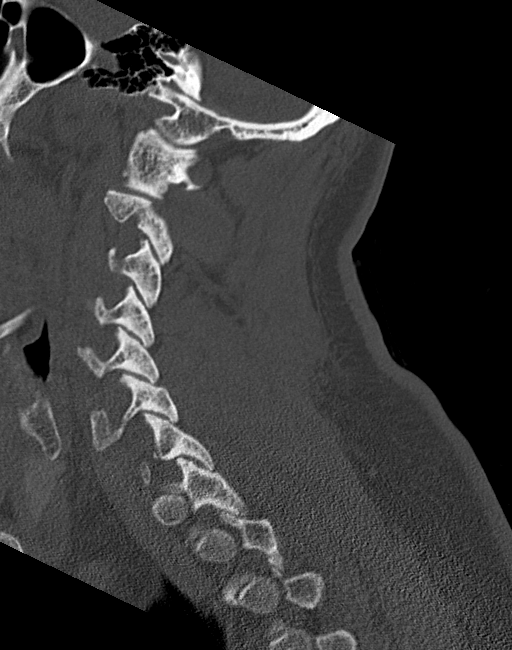
[im 26/61  bone]
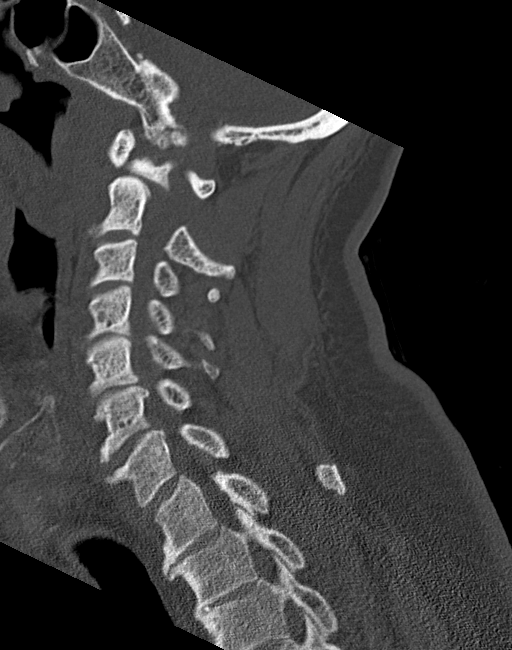
[im 31/61  soft-tissue]
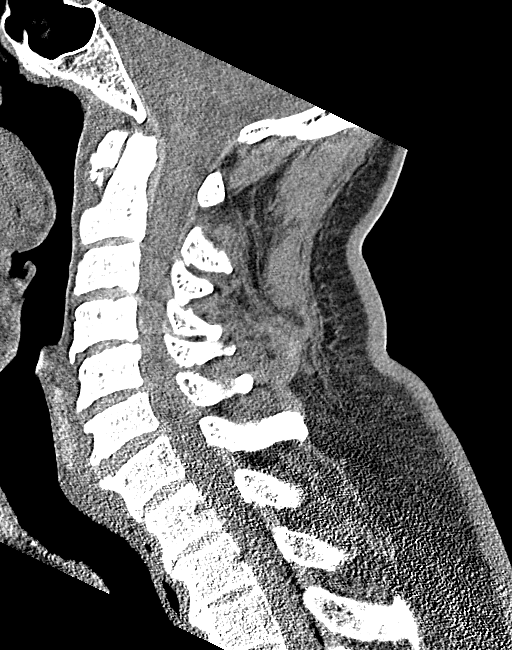
[im 31/61  bone]
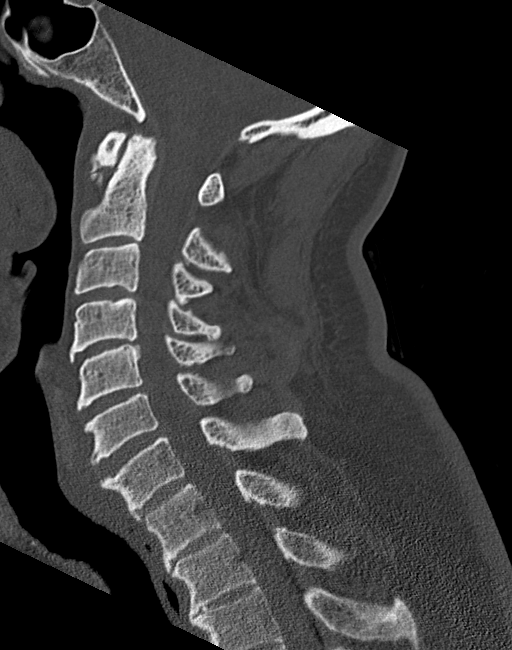
[im 36/61  bone]
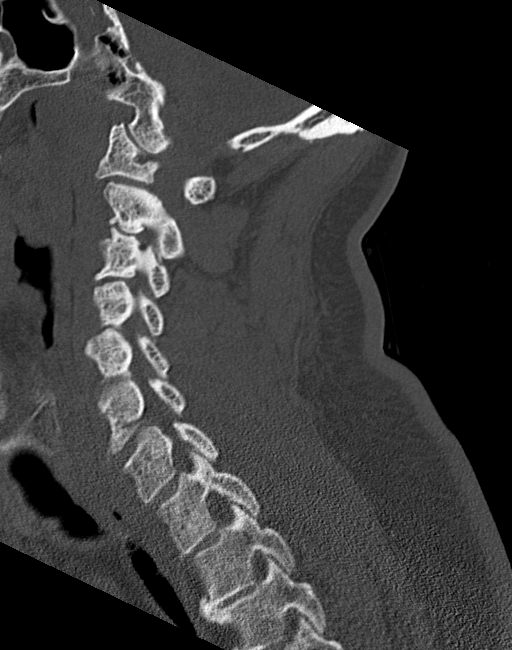
[im 41/61  bone]
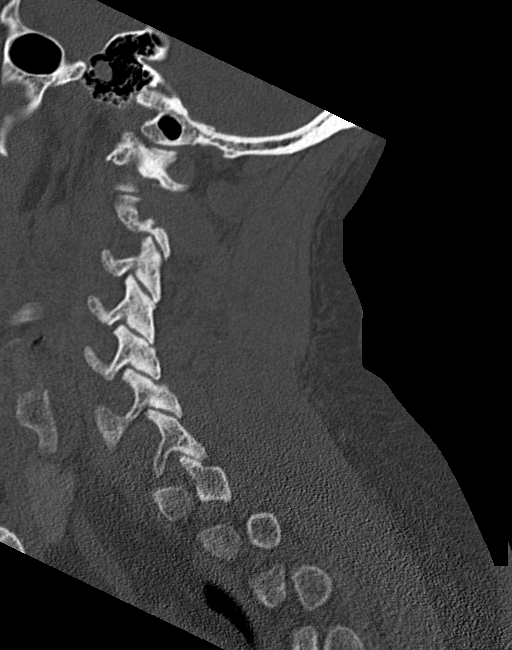

[Series 6: coronal bone · coronal · 0.25mm/px · 2 of 61 slices shown]
[im 21/61  bone]
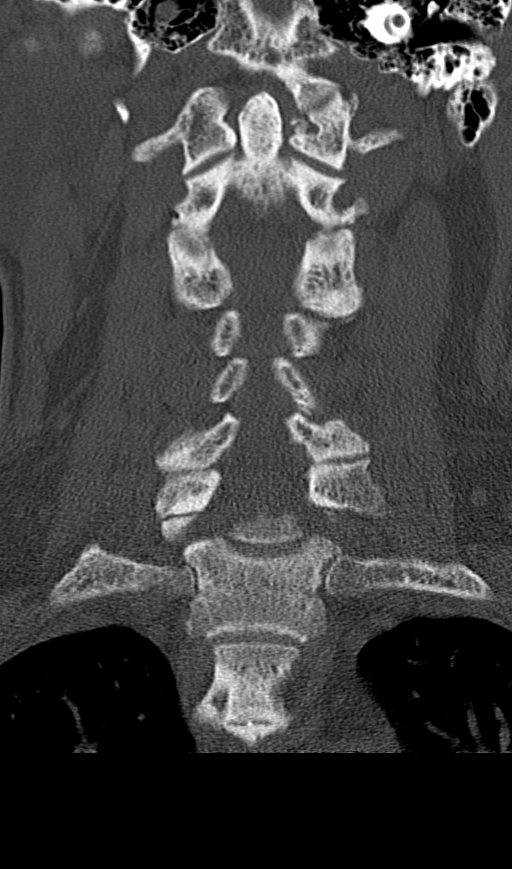
[im 41/61  bone]
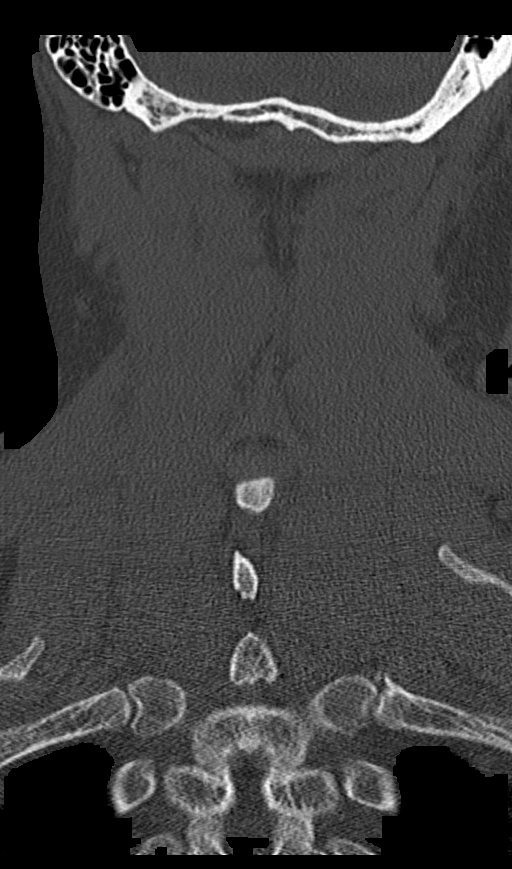

[13 of 33 positions shown; findings below may reference images not displayed]

FINDINGS: CT MAXILLOFACIAL FINDINGS

Osseous: Negative for facial fracture.

Poor dentition. Multiple caries and extensive bone resorption around
multiple teeth.

Orbits: Negative for orbital fracture or mass.

Sinuses: Mucosal edema paranasal sinuses without air-fluid level.
Mastoid sinus clear bilaterally.

Soft tissues: Negative

Limited intracranial: Generalized atrophy. Encephalomalacia inferior
frontal lobe on the right. No acute abnormality.

CT CERVICAL FINDINGS

Alignment: Normal

Skull base and vertebrae: Negative for cervical spine fracture.

Soft tissues and spinal canal: Negative

Disc levels: Mild disc degeneration and spurring C4-5 and C5-6
causing spinal stenosis

Upper chest: Negative

Other: Nondisplaced fracture distal right clavicle. No significant
bony union, possibly recent fracture. Correlate with x-ray and
physical exam.
IMPRESSION: Negative for facial fracture

Poor dentition

Negative for cervical spine fracture

Fracture distal right clavicle, recommend radiographic evaluation
and physical exam of this area.

## 2018-12-22 IMAGING — CT CT MAXILLOFACIAL W/ CM
3 of 5 series · 15 of 47 positions shown, 18 images · IV contrast (Isovue)
Comparison: None.

CLINICAL DATA: Fall

EXAM:
CT MAXILLOFACIAL WITHOUT CONTRAST
CT CERVICAL SPINE WITHOUT CONTRAST
TECHNIQUE: Multidetector CT imaging of the maxillofacial structures was
performed. Multiplanar CT image reconstructions were also generated.
A small metallic BB was placed on the right temple in order to
reliably differentiate right from left.
Multidetector CT imaging of the cervical spine was performed without
intravenous contrast. Multiplanar CT image reconstructions were also
generated.

[Series 2: max soft · axial · 0.36mm/px · z∈[+1398,+1540]mm · 10 of 85 slices shown, 13 images]
[im 7/85  brain]
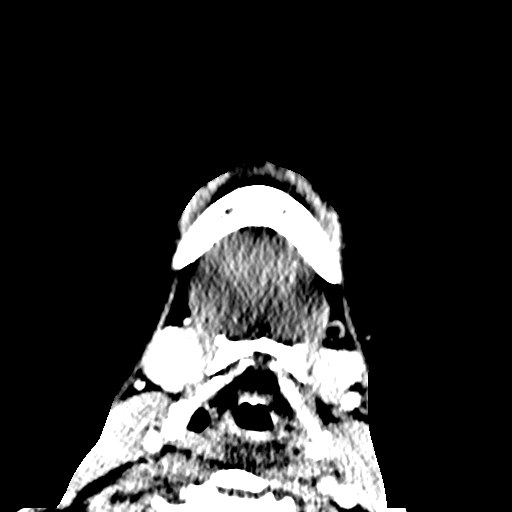
[im 7/85  bone]
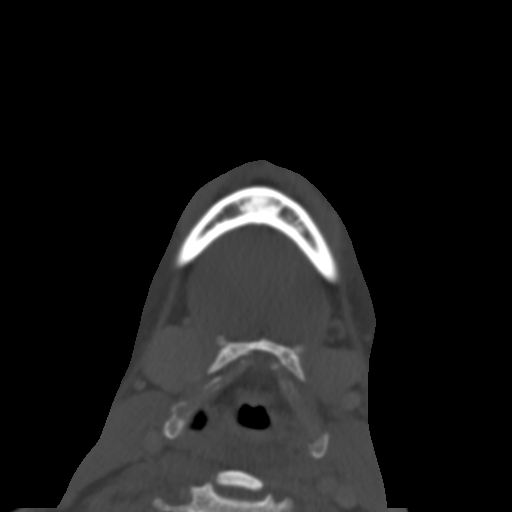
[im 14/85  bone]
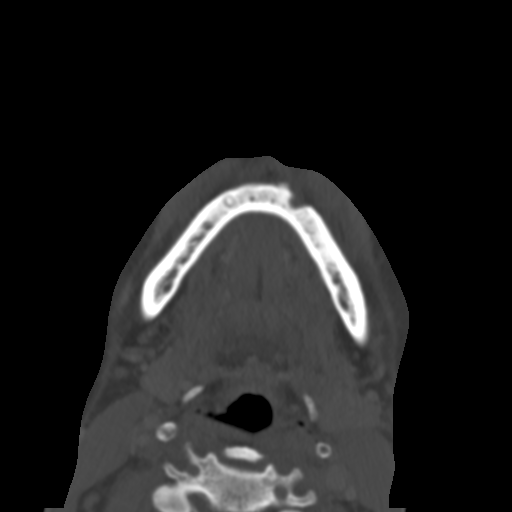
[im 24/85  bone]
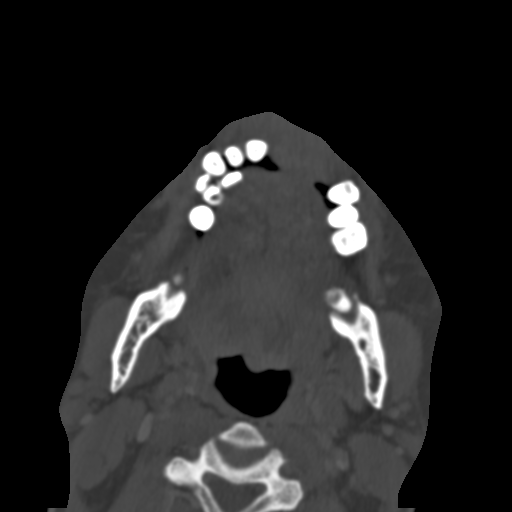
[im 31/85  bone]
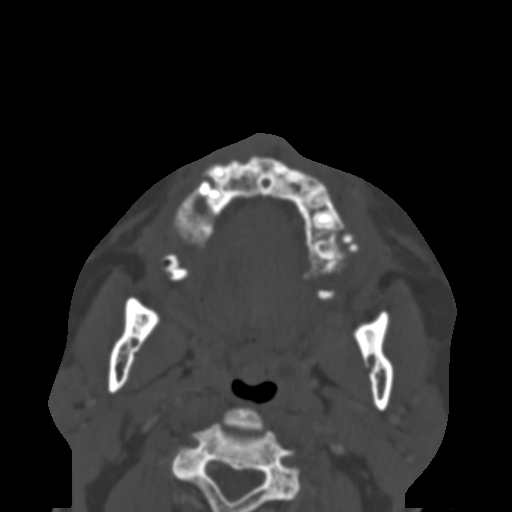
[im 37/85  brain]
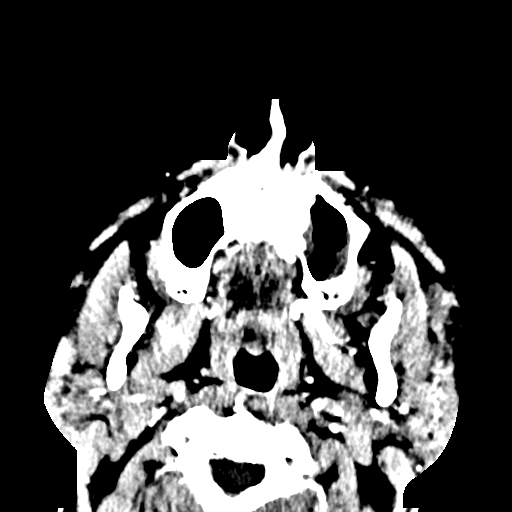
[im 37/85  bone]
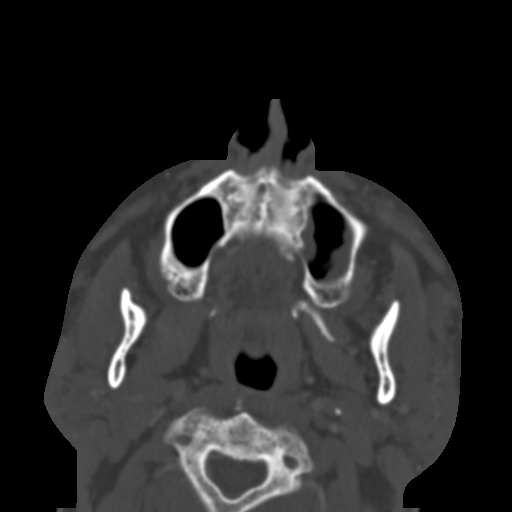
[im 48/85  bone]
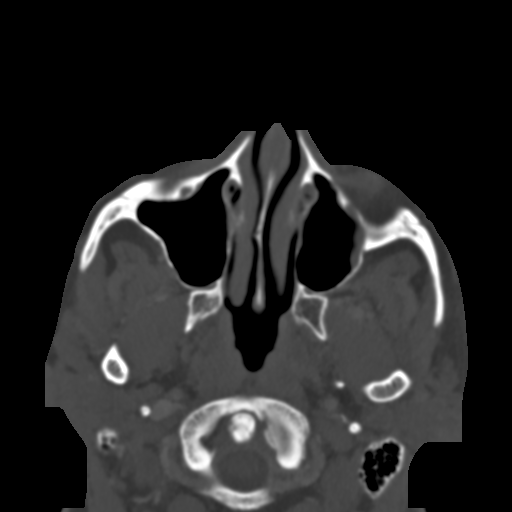
[im 54/85  bone]
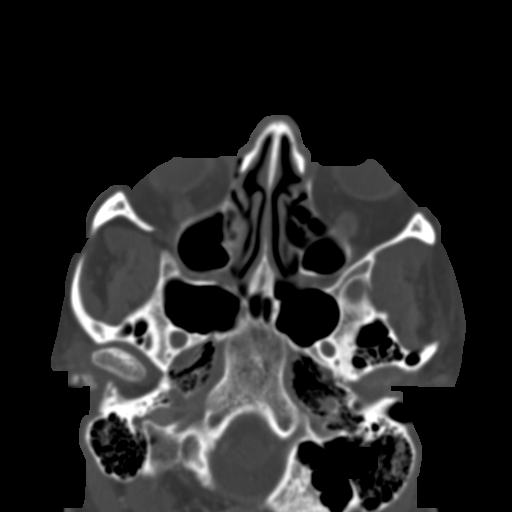
[im 64/85  bone]
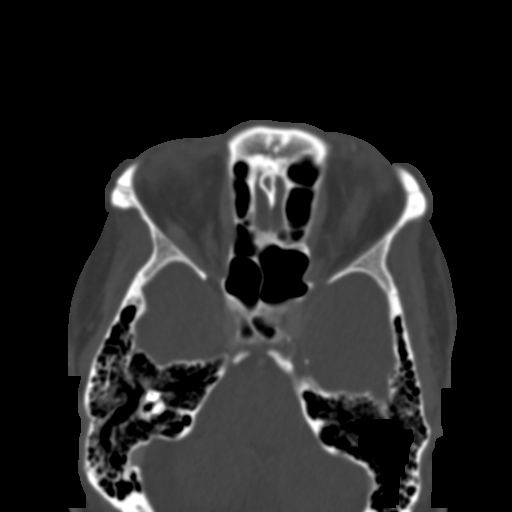
[im 71/85  brain]
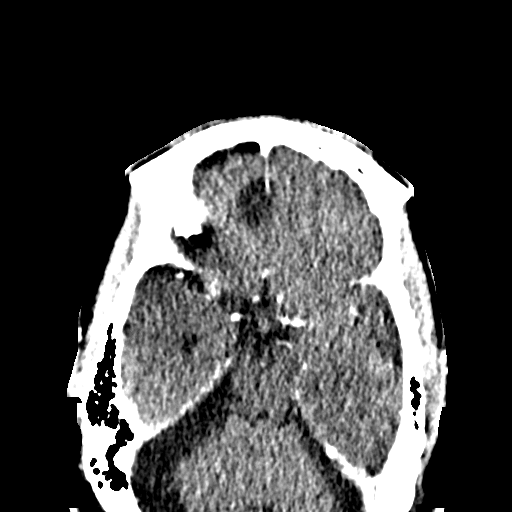
[im 71/85  bone]
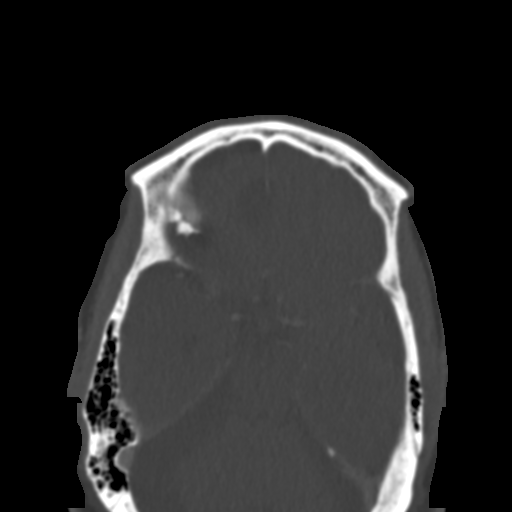
[im 78/85  bone]
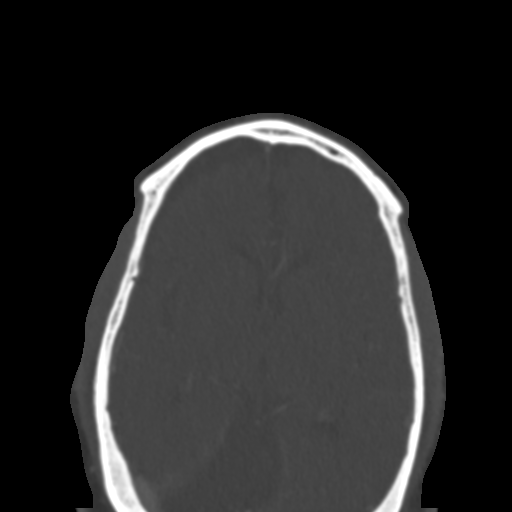

[Series 6: coronal soft · coronal · 0.33mm/px · 3 of 87 slices shown]
[im 29/87  bone]
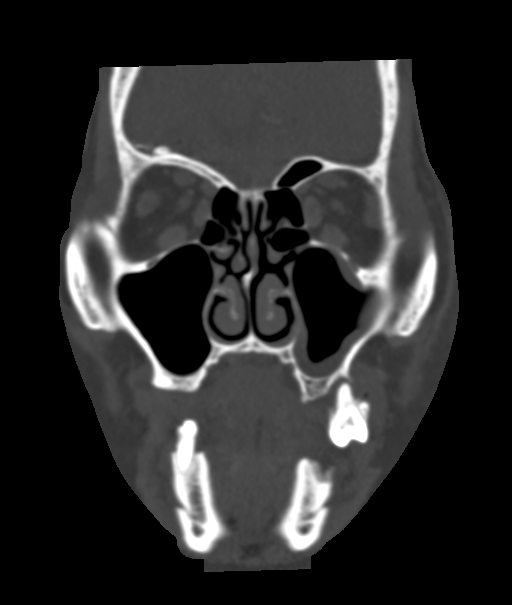
[im 39/87  bone]
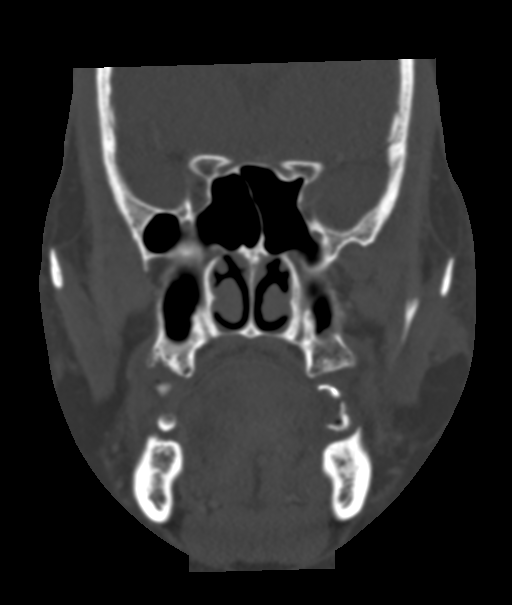
[im 48/87  bone]
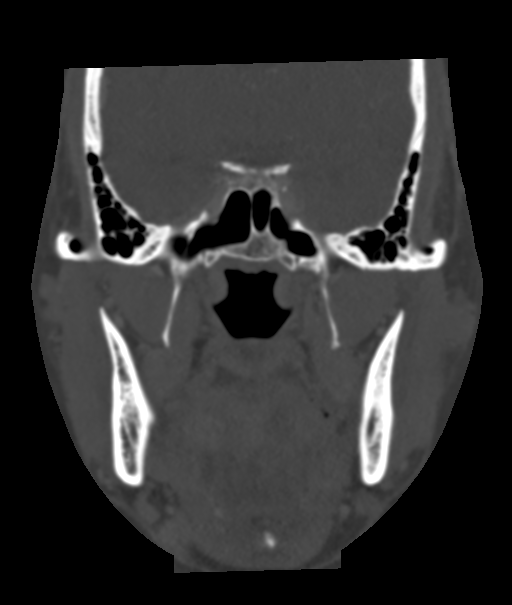

[Series 9: sagittal bone · sagittal · 0.28mm/px · 2 of 84 slices shown]
[im 28/84  bone]
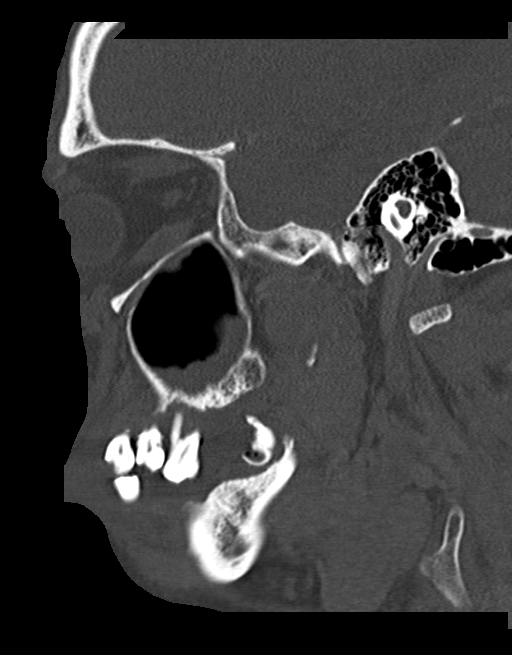
[im 56/84  bone]
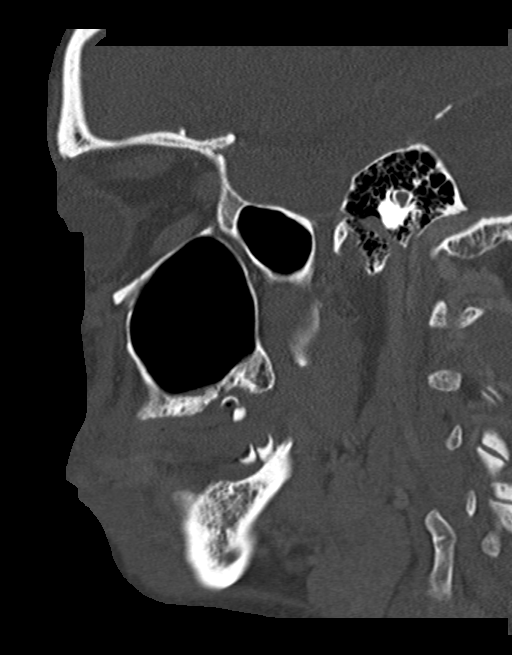

[15 of 47 positions shown; findings below may reference images not displayed]

FINDINGS: CT MAXILLOFACIAL FINDINGS

Osseous: Negative for facial fracture.

Poor dentition. Multiple caries and extensive bone resorption around
multiple teeth.

Orbits: Negative for orbital fracture or mass.

Sinuses: Mucosal edema paranasal sinuses without air-fluid level.
Mastoid sinus clear bilaterally.

Soft tissues: Negative

Limited intracranial: Generalized atrophy. Encephalomalacia inferior
frontal lobe on the right. No acute abnormality.

CT CERVICAL FINDINGS

Alignment: Normal

Skull base and vertebrae: Negative for cervical spine fracture.

Soft tissues and spinal canal: Negative

Disc levels: Mild disc degeneration and spurring C4-5 and C5-6
causing spinal stenosis

Upper chest: Negative

Other: Nondisplaced fracture distal right clavicle. No significant
bony union, possibly recent fracture. Correlate with x-ray and
physical exam.
IMPRESSION: Negative for facial fracture

Poor dentition

Negative for cervical spine fracture

Fracture distal right clavicle, recommend radiographic evaluation
and physical exam of this area.

## 2022-03-22 ENCOUNTER — Other Ambulatory Visit (HOSPITAL_COMMUNITY): Payer: Self-pay | Admitting: Adult Health

## 2022-03-22 ENCOUNTER — Ambulatory Visit (HOSPITAL_COMMUNITY)
Admission: RE | Admit: 2022-03-22 | Discharge: 2022-03-22 | Disposition: A | Discharge status: Home / Self Care | Payer: Medicare Other | Source: Ambulatory Visit | Attending: Adult Health | Admitting: Adult Health

## 2022-03-22 DIAGNOSIS — M25541 Pain in joints of right hand: Secondary | ICD-10-CM

## 2022-03-22 DIAGNOSIS — R600 Localized edema: Secondary | ICD-10-CM

## 2022-03-23 ENCOUNTER — Encounter (HOSPITAL_COMMUNITY): Payer: Self-pay | Admitting: Emergency Medicine

## 2022-03-30 DEATH — deceased
